# Patient Record
Sex: Female | Born: 1955 | Race: Black or African American | Hispanic: No | Marital: Married | Smoking: Former smoker
Health system: Southern US, Community
[De-identification: ages and names within clinical notes are randomized; demographics above are authoritative.]

## PROBLEM LIST (undated history)

## (undated) DIAGNOSIS — K219 Gastro-esophageal reflux disease without esophagitis: Secondary | ICD-10-CM

## (undated) HISTORY — PX: ABDOMINAL HYSTERECTOMY: SHX81

## (undated) HISTORY — PX: APPENDECTOMY: SHX54

---

## 2006-08-18 ENCOUNTER — Ambulatory Visit: Payer: Self-pay | Admitting: Obstetrics and Gynecology

## 2008-02-10 ENCOUNTER — Ambulatory Visit: Payer: Self-pay | Admitting: Obstetrics and Gynecology

## 2008-02-22 ENCOUNTER — Ambulatory Visit: Payer: Self-pay | Admitting: Obstetrics and Gynecology

## 2009-10-16 ENCOUNTER — Inpatient Hospital Stay: Payer: Self-pay | Admitting: Surgery

## 2009-10-16 ENCOUNTER — Ambulatory Visit: Payer: Self-pay | Admitting: Internal Medicine

## 2009-10-24 ENCOUNTER — Inpatient Hospital Stay: Payer: Self-pay | Admitting: Surgery

## 2009-11-13 ENCOUNTER — Ambulatory Visit: Payer: Self-pay | Admitting: General Surgery

## 2009-11-21 ENCOUNTER — Ambulatory Visit: Payer: Self-pay | Admitting: Specialist

## 2009-12-04 ENCOUNTER — Other Ambulatory Visit: Payer: Self-pay | Admitting: General Surgery

## 2009-12-05 ENCOUNTER — Ambulatory Visit: Payer: Self-pay | Admitting: General Surgery

## 2009-12-11 ENCOUNTER — Ambulatory Visit: Payer: Self-pay | Admitting: General Surgery

## 2009-12-18 ENCOUNTER — Ambulatory Visit: Payer: Self-pay | Admitting: General Surgery

## 2010-12-18 IMAGING — CT CT ABD-PELV W/ CM
1 of 2 series · 14 of 32 positions shown, 18 images · IV contrast (isovue)
Comparison: 10/16/2009

REASON FOR EXAM: Nausea Vomiting Diarrhea  Call Report [id number] Pt to
return to dr office
COMMENTS:

PROCEDURE:     CT  - CT ABDOMEN / PELVIS  W  - October 24, 2009 [DATE]
RESULT:     History: Nausea, vomiting, diarrhea
TECHNIQUE: Multiple axial images of the abdomen and pelvis were performed
from the lung bases to the pubic symphysis, with p.o. contrast and with 100
ml of Isovue 370 intravenous contrast.

[Series 2: abdomen · axial · 0.62mm/px · z∈[-142,+292]mm · 14 of 95 slices shown, 18 images]
[im 4/95  soft-tissue]
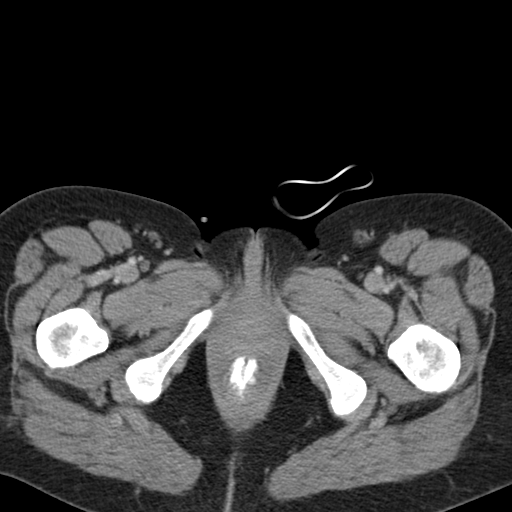
[im 4/95  bone]
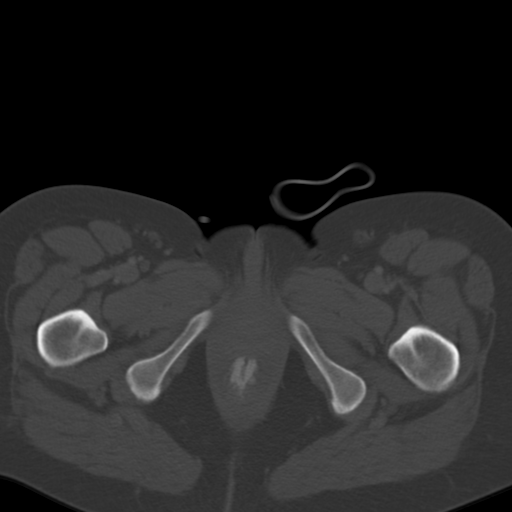
[im 12/95  soft-tissue]
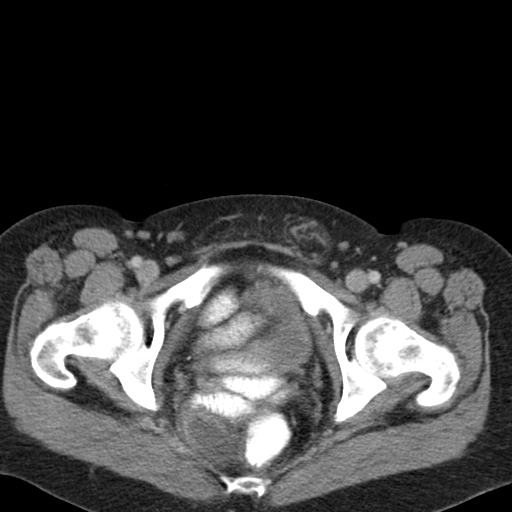
[im 20/95  soft-tissue]
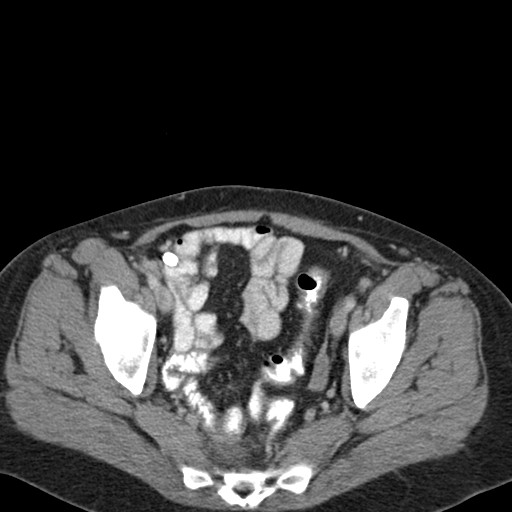
[im 28/95  soft-tissue]
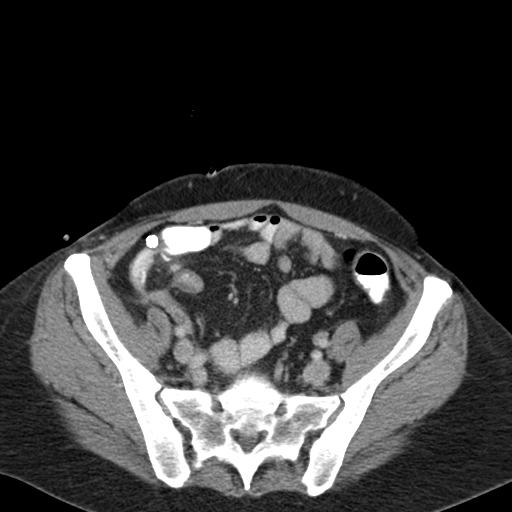
[im 36/95  soft-tissue]
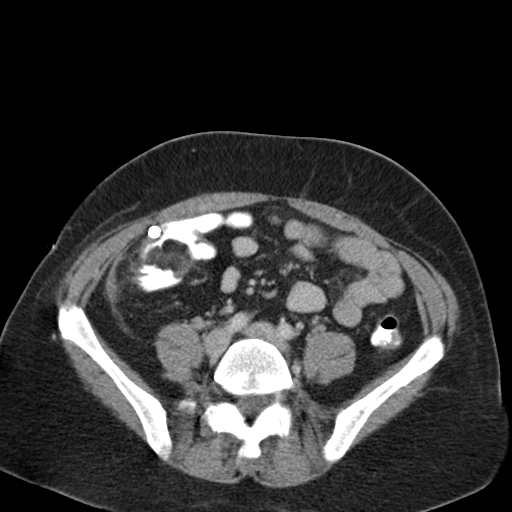
[im 44/95  soft-tissue]
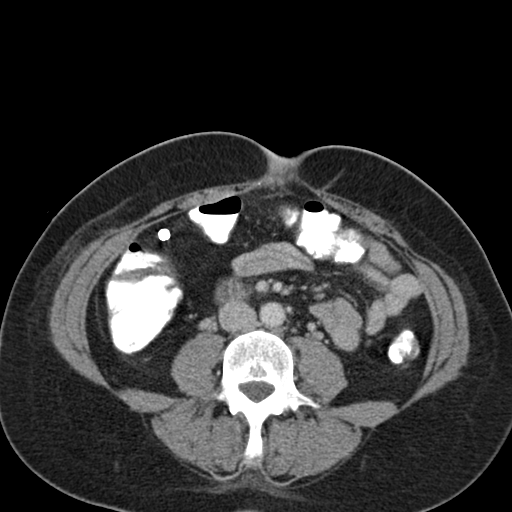
[im 51/95  soft-tissue]
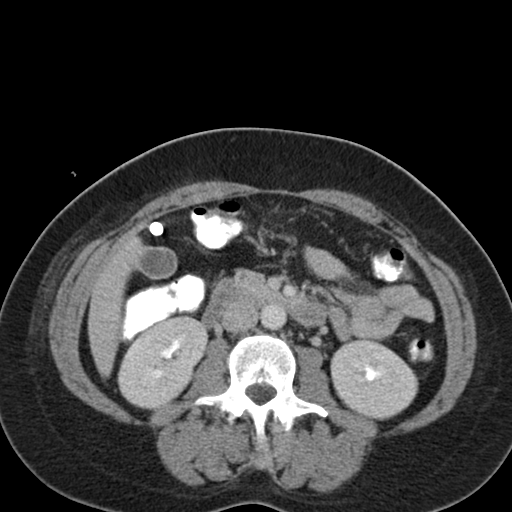
[im 59/95  soft-tissue]
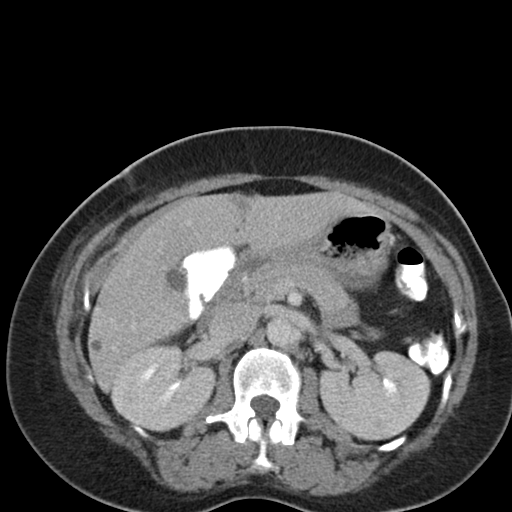
[im 67/95  soft-tissue]
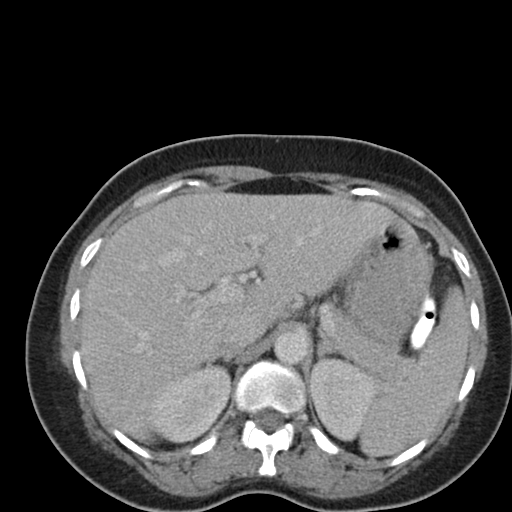
[im 67/95  bone]
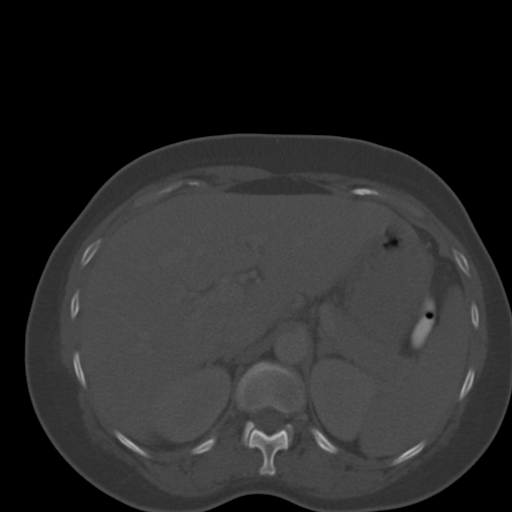
[im 75/95  soft-tissue]
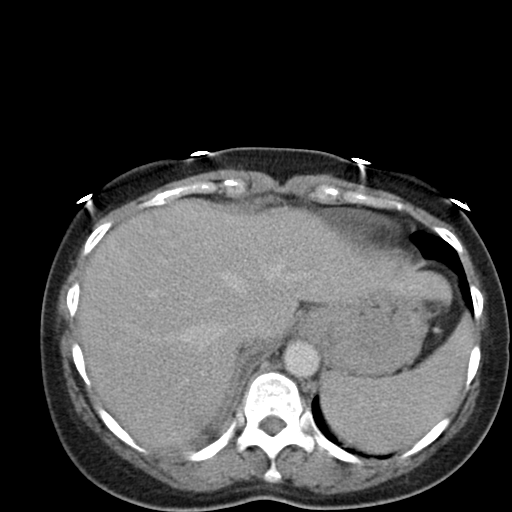
[im 79/95  lung]
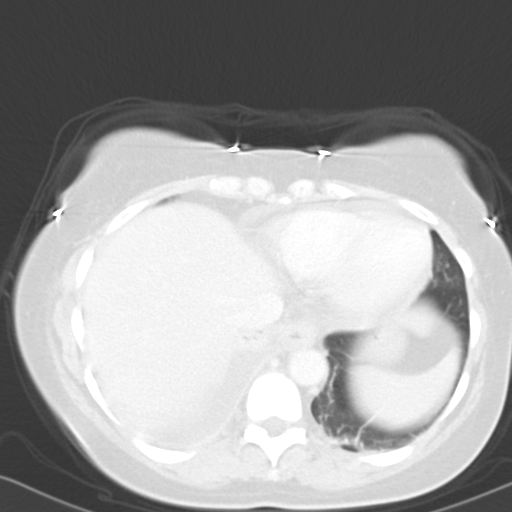
[im 83/95  soft-tissue]
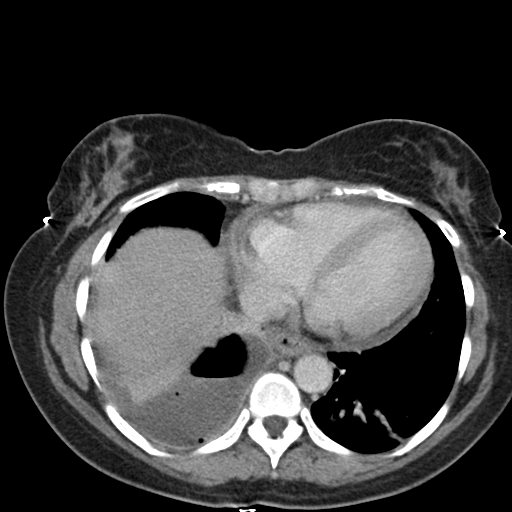
[im 83/95  lung]
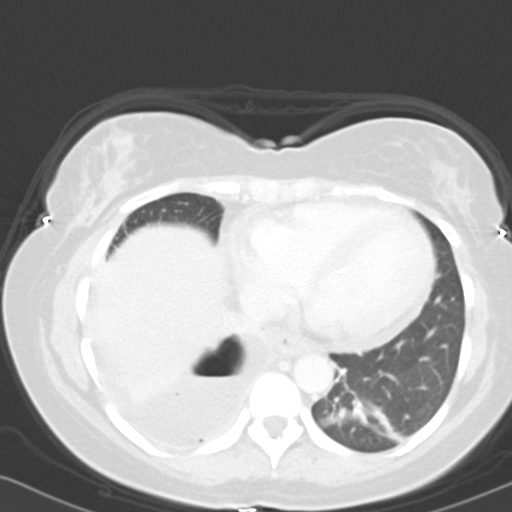
[im 87/95  lung]
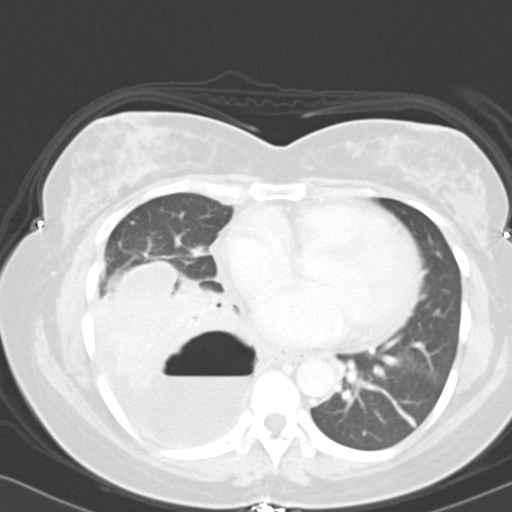
[im 91/95  soft-tissue]
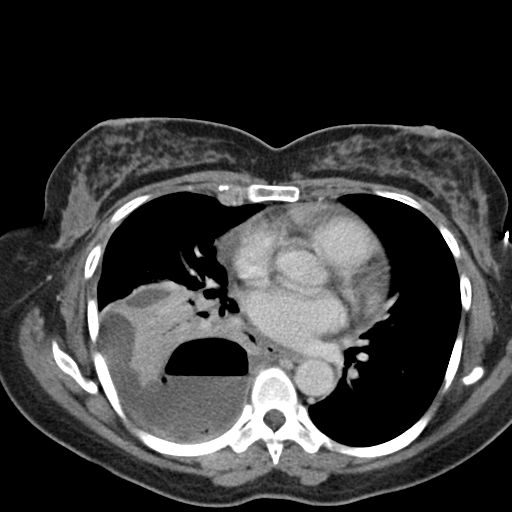
[im 91/95  lung]
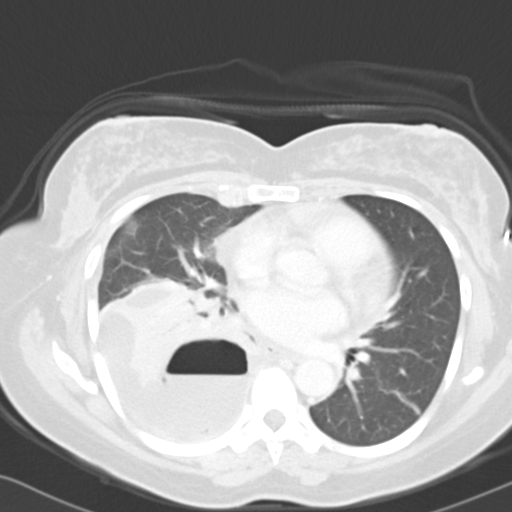

[14 of 32 positions shown; findings below may reference images not displayed]

FINDINGS: There is a moderate sized right pleural effusion with ac air-fluid level
most concerning for an empyema in the absence of recent instrumentation.
There is associated airspace disease which may represent atelectasis versus
infiltrate. There is left basilar atelectasis.

The liver demonstrates no focal abnormality. There is no intrahepatic or
extrahepatic biliary ductal dilatation. The gallbladder is unremarkable. The
spleen demonstrates no focal abnormality. The kidneys, adrenal glands, and
pancreas are normal. The bladder is unremarkable.

The stomach, duodenum, small intestine, and large intestine demonstrate no
contrast extravasation or dilatation. There is a 3 cm right perirectal fluid
collection most concerning for an abscess. There is a small 1.2 x 2.5 cm
fluid collection along the right lower quadrant which may represent an
abscess versus postoperative seroma. There is a surgical drain in the right
lower quadrant. There is no pneumoperitoneum, pneumatosis, or portal venous
gas. There is no abdominal or pelvic free fluid. There is no
lymphadenopathy.

The abdominal aorta is normal in caliber with atherosclerosis.

The osseous structures are unremarkable.
IMPRESSION: 1. There is a moderate sized right pleural effusion with ac air-fluid level
most concerning for an empyema in the absence of recent instrumentation.
There is associated airspace disease which may represent atelectasis versus
infiltrate. There is left basilar atelectasis.

2. There is a 3 cm right perirectal fluid collection most concerning for an
abscess. There is a small 1.2 x 2.5 cm fluid collection along the right
lower quadrant which may represent an abscess versus postoperative seroma.

These findings were communicated to Dr. Padam on 10/24/2009 at 1053 hours.

## 2010-12-19 IMAGING — US US PERC PLEURAL DRAIN W/INDWELL CATH W/IMG GUIDE
1 series · 1 of 1 positions shown · non-contrast
Comparison: none

REASON FOR EXAM: emphyema
COMMENTS:

PROCEDURE:     US  - US PLEURAL EFFUSION RIGHT  - October 25, 2009  [DATE]
RESULT:     Comparison: None

[Series 1: us perc pleural drain w/indwell cath w/img guide · 1 of 1 slices shown]
[im 1/1]
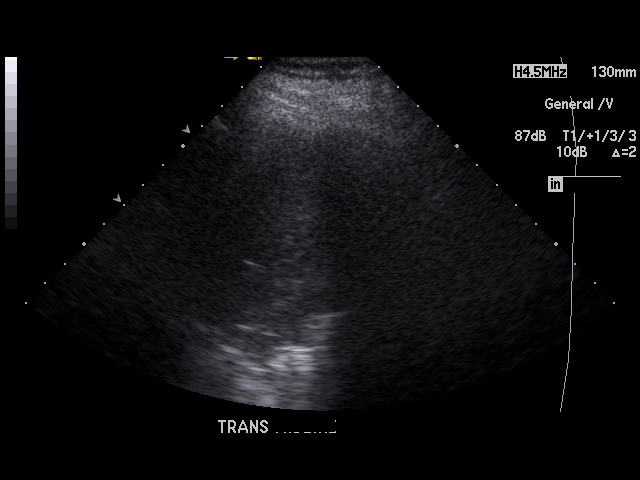

[1 of 1 positions shown; findings below may reference images not displayed]

FINDINGS: Single sonographic images obtained labeled transmidline. Imaging was
performed without a radiologist present. Imaging was utilized by Dr. Remind
for thoracentesis tube placement. There is fluid present.
IMPRESSION: Please see above.

## 2010-12-20 IMAGING — CR DG CHEST 1V PORT
1 series · 1 of 1 positions shown · non-contrast
Comparison: none

REASON FOR EXAM: right empyema
COMMENTS:

[view not recorded]
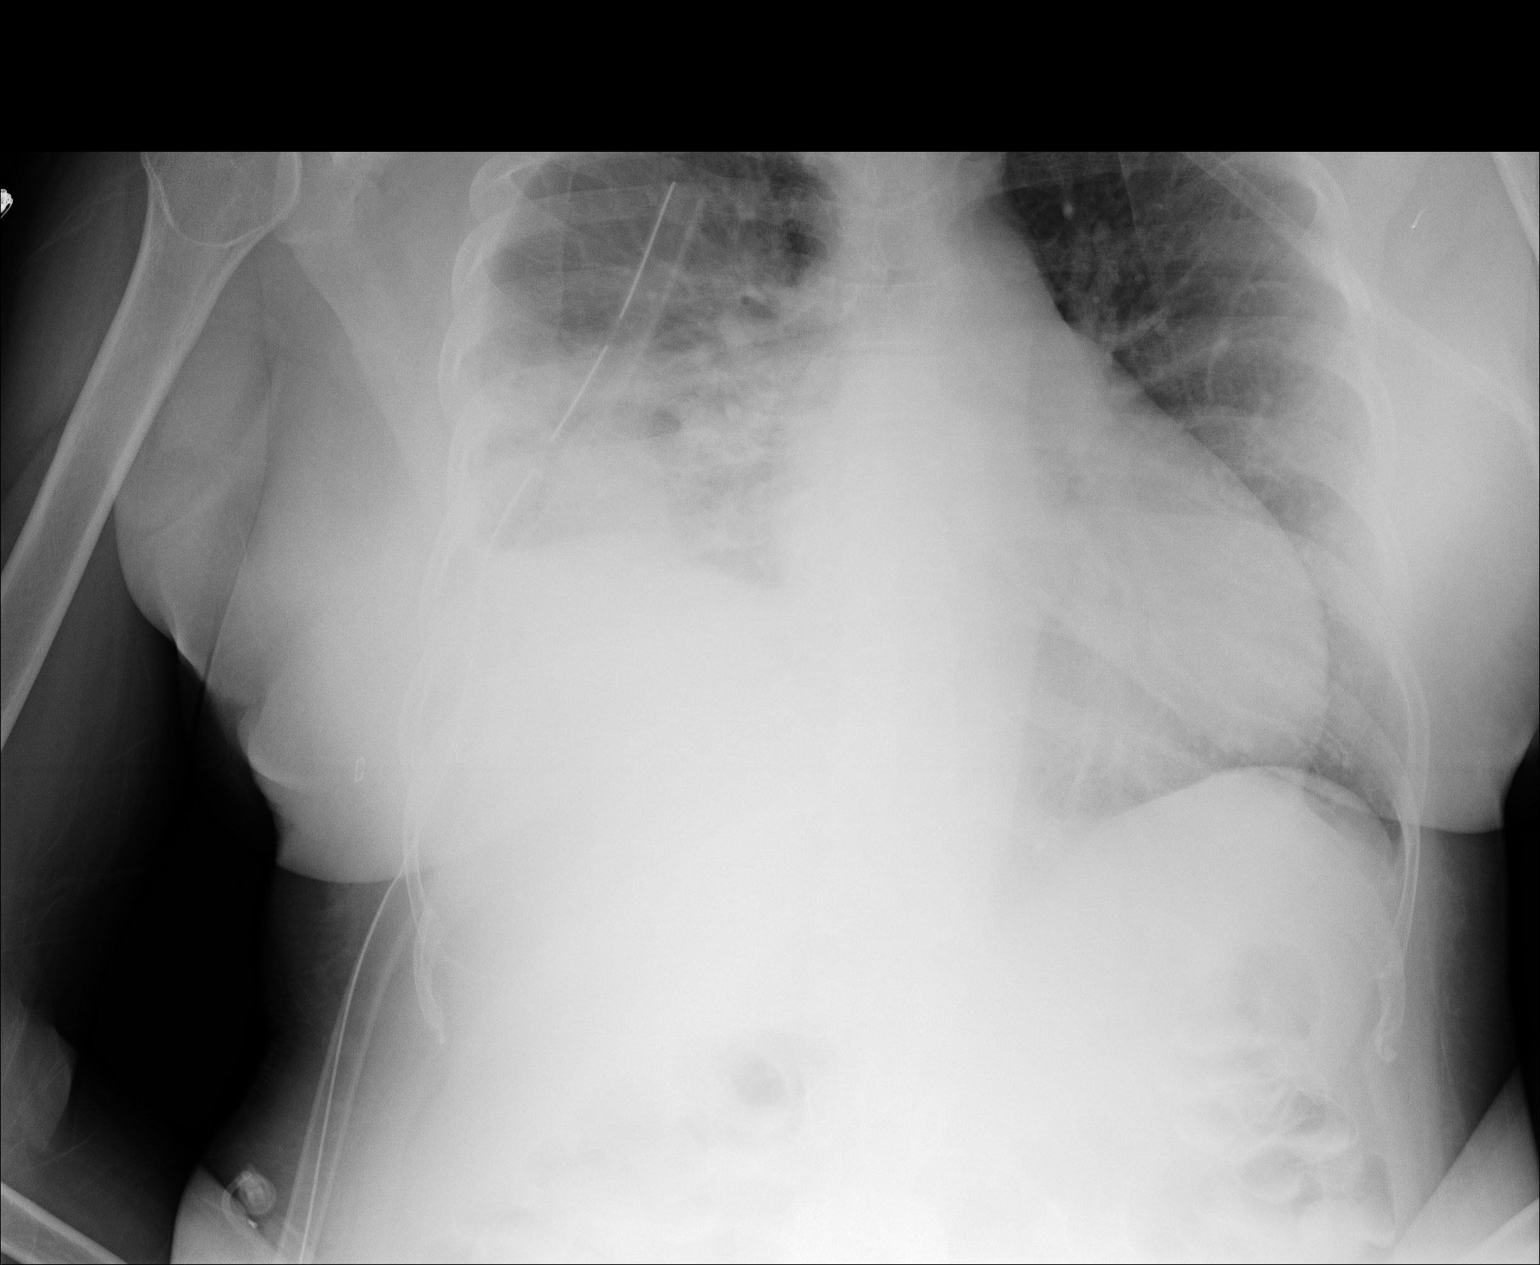

[1 of 1 positions shown; findings below may reference images not displayed]

PROCEDURE:     DXR - DXR PORTABLE CHEST SINGLE VIEW  - October 26, 2009  [DATE]

RESULT:     Comparison is made to study 25 October, 2009.

The chest tube remains in place on the right. The lung remains low volume.
Elevation of the right hemidiaphragm and/or right basilar atelectasis is
persistent. The left lung is clear. The cardiac silhouette is top normal in
size and the central pulmonary vascularity is prominent.
IMPRESSION: 1. There has been little change in the appearance of the right lung.
2. The left lung is clear.

## 2012-03-02 ENCOUNTER — Ambulatory Visit: Payer: Self-pay | Admitting: Family Medicine

## 2014-05-18 ENCOUNTER — Ambulatory Visit: Payer: Self-pay | Admitting: Family Medicine

## 2015-04-27 ENCOUNTER — Encounter (HOSPITAL_BASED_OUTPATIENT_CLINIC_OR_DEPARTMENT_OTHER): Payer: Self-pay | Admitting: *Deleted

## 2015-04-27 NOTE — H&P (Signed)
  Subjective:    Patient ID: Connie Atkins is a 59 y.o. female.  HPI  Referred by daughter who was treated in this office for similar problem. Reports 2 years history posterior left thigh/buttock mass that has continued to grow and now painful, especially with sitting. 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 No similar lesions.   Most significant PMH is ruptured appy at age 50 requiring 72 month hospital stay for sepsis.  Review of Systems    12 point review negative  Objective:   Physical Exam  Cardiovascular: Normal rate and regular rhythm.  Pulmonary/Chest: Effort normal and breath sounds normal.  Abdominal: Soft.  Musculoskeletal:  Posterior left thigh at junction with buttock, slightly distal to buttock crease nonmobile mass 5 x 16 cm with TTP, no punctum, no fluctulence, firm  Skin:  Fitzpatrick 6   Multiple hypopigmented scars legs, some flat hyperpigmented scars back    Assessment:     Lipoma     Plan:     Suspect lipoma. Given growth and now symptomatic rec excision. Given size plan in OR. Reviewed incisions, sutures, time off work. Would try to keep pressure off area as much as possible while healing and this may mean altering her work routine. Reviewed benign nature of majority lipomas, will send for pathology to ensure. Pictures taken. Discussed scar maturation will take months, risk hyper or hypopigmentation given skin type.          Irene Limbo, MD [facility] Plastic & Reconstructive Surgery [phone]

## 2015-04-28 ENCOUNTER — Encounter (HOSPITAL_BASED_OUTPATIENT_CLINIC_OR_DEPARTMENT_OTHER)
Admission: RE | Disposition: A | Discharge status: Home / Self Care | Payer: Self-pay | Source: Ambulatory Visit | Attending: Plastic Surgery

## 2015-04-28 ENCOUNTER — Ambulatory Visit (HOSPITAL_BASED_OUTPATIENT_CLINIC_OR_DEPARTMENT_OTHER): Payer: 59 | Admitting: Anesthesiology

## 2015-04-28 ENCOUNTER — Encounter (HOSPITAL_BASED_OUTPATIENT_CLINIC_OR_DEPARTMENT_OTHER): Payer: Self-pay | Admitting: Anesthesiology

## 2015-04-28 ENCOUNTER — Ambulatory Visit (HOSPITAL_BASED_OUTPATIENT_CLINIC_OR_DEPARTMENT_OTHER)
Admission: RE | Admit: 2015-04-28 | Discharge: 2015-04-28 | Disposition: A | Discharge status: Home / Self Care | Payer: 59 | Source: Ambulatory Visit | Attending: Plastic Surgery | Admitting: Plastic Surgery

## 2015-04-28 DIAGNOSIS — K219 Gastro-esophageal reflux disease without esophagitis: Secondary | ICD-10-CM | POA: Diagnosis not present

## 2015-04-28 DIAGNOSIS — Z87891 Personal history of nicotine dependence: Secondary | ICD-10-CM | POA: Diagnosis not present

## 2015-04-28 DIAGNOSIS — D1724 Benign lipomatous neoplasm of skin and subcutaneous tissue of left leg: Secondary | ICD-10-CM | POA: Diagnosis present

## 2015-04-28 DIAGNOSIS — L720 Epidermal cyst: Secondary | ICD-10-CM | POA: Insufficient documentation

## 2015-04-28 HISTORY — DX: Gastro-esophageal reflux disease without esophagitis: K21.9

## 2015-04-28 HISTORY — PX: MASS EXCISION: SHX2000

## 2015-04-28 LAB — POCT HEMOGLOBIN-HEMACUE
Hemoglobin: 14.3 g/dL (ref 12.0–15.0)
Hemoglobin: 15.3 g/dL — ABNORMAL HIGH (ref 12.0–15.0)

## 2015-04-28 SURGERY — EXCISION MASS
Anesthesia: General | Site: Buttocks | Laterality: Left

## 2015-04-28 MED ORDER — HYDROMORPHONE HCL 1 MG/ML IJ SOLN
0.2500 mg | INTRAMUSCULAR | Status: DC | PRN
  Administered 2015-04-28: 0.5 mg via INTRAVENOUS

## 2015-04-28 MED ORDER — OXYCODONE HCL 5 MG/5ML PO SOLN: 5.0000 mg | Freq: Once | ORAL | Status: AC | PRN

## 2015-04-28 MED ORDER — BUPIVACAINE-EPINEPHRINE (PF) 0.25% -1:200000 IJ SOLN
INTRAMUSCULAR | Status: AC
  Filled 2015-04-28: qty 30

## 2015-04-28 MED ORDER — CHLORHEXIDINE GLUCONATE 4 % EX LIQD: 1.0000 "application " | Freq: Once | CUTANEOUS | Status: DC

## 2015-04-28 MED ORDER — FENTANYL CITRATE (PF) 100 MCG/2ML IJ SOLN
INTRAMUSCULAR | Status: AC
  Filled 2015-04-28: qty 4

## 2015-04-28 MED ORDER — BUPIVACAINE-EPINEPHRINE 0.25% -1:200000 IJ SOLN
INTRAMUSCULAR | Status: DC | PRN
  Administered 2015-04-28: 15 mL

## 2015-04-28 MED ORDER — OXYCODONE HCL 5 MG PO TABS
5.0000 mg | ORAL_TABLET | Freq: Once | ORAL | Status: AC | PRN
  Administered 2015-04-28: 5 mg via ORAL

## 2015-04-28 MED ORDER — SUCCINYLCHOLINE CHLORIDE 20 MG/ML IJ SOLN
INTRAMUSCULAR | Status: DC | PRN
  Administered 2015-04-28: 100 mg via INTRAVENOUS

## 2015-04-28 MED ORDER — LACTATED RINGERS IV SOLN
INTRAVENOUS | Status: DC
  Administered 2015-04-28 (×2): via INTRAVENOUS

## 2015-04-28 MED ORDER — ONDANSETRON HCL 4 MG/2ML IJ SOLN
INTRAMUSCULAR | Status: DC | PRN
  Administered 2015-04-28: 4 mg via INTRAVENOUS

## 2015-04-28 MED ORDER — CEFAZOLIN SODIUM-DEXTROSE 2-3 GM-% IV SOLR
INTRAVENOUS | Status: AC
  Filled 2015-04-28: qty 50

## 2015-04-28 MED ORDER — PROPOFOL 10 MG/ML IV BOLUS
INTRAVENOUS | Status: DC | PRN
  Administered 2015-04-28: 180 mg via INTRAVENOUS

## 2015-04-28 MED ORDER — HYDROMORPHONE HCL 1 MG/ML IJ SOLN
INTRAMUSCULAR | Status: AC
  Filled 2015-04-28: qty 1

## 2015-04-28 MED ORDER — MIDAZOLAM HCL 5 MG/5ML IJ SOLN
INTRAMUSCULAR | Status: DC | PRN
  Administered 2015-04-28: 2 mg via INTRAVENOUS

## 2015-04-28 MED ORDER — DEXAMETHASONE SODIUM PHOSPHATE 4 MG/ML IJ SOLN
INTRAMUSCULAR | Status: DC | PRN
  Administered 2015-04-28: 10 mg via INTRAVENOUS

## 2015-04-28 MED ORDER — OXYCODONE HCL 5 MG PO TABS
ORAL_TABLET | ORAL | Status: AC
  Filled 2015-04-28: qty 1

## 2015-04-28 MED ORDER — 0.9 % SODIUM CHLORIDE (POUR BTL) OPTIME
TOPICAL | Status: DC | PRN
  Administered 2015-04-28: 200 mL

## 2015-04-28 MED ORDER — ONDANSETRON HCL 4 MG/2ML IJ SOLN: 4.0000 mg | Freq: Four times a day (QID) | INTRAMUSCULAR | Status: DC | PRN

## 2015-04-28 MED ORDER — LIDOCAINE HCL (CARDIAC) 20 MG/ML IV SOLN
INTRAVENOUS | Status: DC | PRN
  Administered 2015-04-28: 50 mg via INTRAVENOUS

## 2015-04-28 MED ORDER — FENTANYL CITRATE (PF) 100 MCG/2ML IJ SOLN
INTRAMUSCULAR | Status: DC | PRN
  Administered 2015-04-28 (×3): 50 ug via INTRAVENOUS

## 2015-04-28 MED ORDER — CEFAZOLIN SODIUM-DEXTROSE 2-3 GM-% IV SOLR
2.0000 g | INTRAVENOUS | Status: AC
  Administered 2015-04-28: 2 g via INTRAVENOUS

## 2015-04-28 MED ORDER — MIDAZOLAM HCL 2 MG/2ML IJ SOLN
INTRAMUSCULAR | Status: AC
  Filled 2015-04-28: qty 2

## 2015-04-28 MED ORDER — HYDROCODONE-ACETAMINOPHEN 5-325 MG PO TABS: 1.0000 | ORAL_TABLET | Freq: Four times a day (QID) | ORAL | Status: AC | PRN

## 2015-04-28 SURGICAL SUPPLY — 58 items
BENZOIN TINCTURE PRP APPL 2/3 (GAUZE/BANDAGES/DRESSINGS) IMPLANT
BLADE CLIPPER SURG (BLADE) IMPLANT
BLADE SURG 11 STRL SS (BLADE) IMPLANT
BLADE SURG 15 STRL LF DISP TIS (BLADE) ×1 IMPLANT
BLADE SURG 15 STRL SS (BLADE) ×1
CANISTER SUCT 1200ML W/VALVE (MISCELLANEOUS) ×2 IMPLANT
CHLORAPREP W/TINT 26ML (MISCELLANEOUS) IMPLANT
COVER BACK TABLE 60X90IN (DRAPES) ×2 IMPLANT
COVER MAYO STAND STRL (DRAPES) ×2 IMPLANT
DRAIN JP 10F RND SILICONE (MISCELLANEOUS) IMPLANT
DRAPE LAPAROTOMY 100X72 PEDS (DRAPES) ×2 IMPLANT
DRAPE U-SHAPE 76X120 STRL (DRAPES) ×2 IMPLANT
ELECT COATED BLADE 2.86 ST (ELECTRODE) ×2 IMPLANT
ELECT NEEDLE BLADE 2-5/6 (NEEDLE) IMPLANT
ELECT REM PT RETURN 9FT ADLT (ELECTROSURGICAL)
ELECT REM PT RETURN 9FT PED (ELECTROSURGICAL)
ELECTRODE REM PT RETRN 9FT PED (ELECTROSURGICAL) IMPLANT
ELECTRODE REM PT RTRN 9FT ADLT (ELECTROSURGICAL) IMPLANT
EVACUATOR SILICONE 100CC (DRAIN) IMPLANT
GAUZE XEROFORM 1X8 LF (GAUZE/BANDAGES/DRESSINGS) IMPLANT
GLOVE BIO SURGEON STRL SZ 6 (GLOVE) ×2 IMPLANT
GLOVE BIOGEL PI IND STRL 7.0 (GLOVE) ×1 IMPLANT
GLOVE BIOGEL PI INDICATOR 7.0 (GLOVE) ×1
GLOVE ECLIPSE 6.5 STRL STRAW (GLOVE) ×2 IMPLANT
GLOVE EXAM NITRILE MD LF STRL (GLOVE) ×2 IMPLANT
GOWN STRL REUS W/ TWL LRG LVL3 (GOWN DISPOSABLE) ×2 IMPLANT
GOWN STRL REUS W/TWL LRG LVL3 (GOWN DISPOSABLE) ×2
LIQUID BAND (GAUZE/BANDAGES/DRESSINGS) IMPLANT
NEEDLE HYPO 30GX1 BEV (NEEDLE) IMPLANT
NEEDLE PRECISIONGLIDE 27X1.5 (NEEDLE) ×2 IMPLANT
NS IRRIG 1000ML POUR BTL (IV SOLUTION) IMPLANT
PACK BASIN DAY SURGERY FS (CUSTOM PROCEDURE TRAY) ×2 IMPLANT
PENCIL BUTTON HOLSTER BLD 10FT (ELECTRODE) ×2 IMPLANT
RUBBERBAND STERILE (MISCELLANEOUS) IMPLANT
SHEET MEDIUM DRAPE 40X70 STRL (DRAPES) IMPLANT
SLEEVE SCD COMPRESS KNEE MED (MISCELLANEOUS) ×2 IMPLANT
SPONGE GAUZE 2X2 8PLY STRL LF (GAUZE/BANDAGES/DRESSINGS) IMPLANT
SPONGE GAUZE 4X4 12PLY STER LF (GAUZE/BANDAGES/DRESSINGS) IMPLANT
SPONGE LAP 18X18 X RAY DECT (DISPOSABLE) ×2 IMPLANT
STRIP CLOSURE SKIN 1/2X4 (GAUZE/BANDAGES/DRESSINGS) IMPLANT
SUCTION FRAZIER TIP 10 FR DISP (SUCTIONS) IMPLANT
SUT ETHILON 4 0 PS 2 18 (SUTURE) IMPLANT
SUT MNCRL AB 4-0 PS2 18 (SUTURE) IMPLANT
SUT MON AB 5-0 P3 18 (SUTURE) IMPLANT
SUT PLAIN 5 0 P 3 18 (SUTURE) IMPLANT
SUT PROLENE 4 0 PS 2 18 (SUTURE) ×2 IMPLANT
SUT PROLENE 5 0 P 3 (SUTURE) IMPLANT
SUT PROLENE 6 0 P 1 18 (SUTURE) IMPLANT
SUT VIC AB 3-0 FS2 27 (SUTURE) ×2 IMPLANT
SUT VICRYL 4-0 PS2 18IN ABS (SUTURE) IMPLANT
SWAB COLLECTION DEVICE MRSA (MISCELLANEOUS) ×2 IMPLANT
SYR BULB 3OZ (MISCELLANEOUS) ×2 IMPLANT
SYR CONTROL 10ML LL (SYRINGE) ×2 IMPLANT
TOWEL OR 17X24 6PK STRL BLUE (TOWEL DISPOSABLE) ×2 IMPLANT
TRAY DSU PREP LF (CUSTOM PROCEDURE TRAY) ×2 IMPLANT
TUBE ANAEROBIC SPECIMEN COL (MISCELLANEOUS) ×2 IMPLANT
TUBE CONNECTING 20X1/4 (TUBING) ×2 IMPLANT
YANKAUER SUCT BULB TIP NO VENT (SUCTIONS) ×2 IMPLANT

## 2015-04-28 NOTE — Anesthesia Procedure Notes (Signed)
Procedure Name: Intubation Date/Time: 04/28/2015 9:23 AM Performed by: Maryella Shivers Pre-anesthesia Checklist: Patient identified, Emergency Drugs available, Suction available and Patient being monitored Patient Re-evaluated:Patient Re-evaluated prior to inductionOxygen Delivery Method: Circle System Utilized Preoxygenation: Pre-oxygenation with 100% oxygen Intubation Type: IV induction Ventilation: Mask ventilation without difficulty Laryngoscope Size: Mac and 3 Grade View: Grade I Tube type: Oral Tube size: 7.0 mm Number of attempts: 1 Airway Equipment and Method: Stylet and Oral airway Placement Confirmation: ETT inserted through vocal cords under direct vision,  positive ETCO2 and breath sounds checked- equal and bilateral Secured at: 20 cm Tube secured with: Tape Dental Injury: Teeth and Oropharynx as per pre-operative assessment

## 2015-04-28 NOTE — Interval H&P Note (Signed)
History and Physical Interval Note:  04/28/2015 6:59 AM  Connie Atkins  has presented today for surgery, with the diagnosis of LIPOMA LEFT BUTTOCK AND THIGH   The various methods of treatment have been discussed with the patient and family. After consideration of risks, benefits and other options for treatment, the patient has consented to  Procedure(s): EXCISION SUBCUTANEOUS MASS 6CM  (N/A) left thigh as a surgical intervention .  The patient's history has been reviewed, patient examined, no change in status, stable for surgery.  I have reviewed the patient's chart and labs.  Questions were answered to the patient's satisfaction.     Connie Atkins

## 2015-04-28 NOTE — Anesthesia Postprocedure Evaluation (Signed)
Anesthesia Post Note  Patient: Connie Atkins  Procedure(s) Performed: Procedure(s) (LRB): EXCISION CYST (RUPTURED) 6CM LEFT POSTERIOR UPPER THIGH/BUTTOCK (Left)  Anesthesia type: General  Patient location: PACU  Post pain: Pain level controlled and Adequate analgesia  Post assessment: Post-op Vital signs reviewed, Patient's Cardiovascular Status Stable, Respiratory Function Stable, Patent Airway and Pain level controlled  Last Vitals:  Filed Vitals:   04/28/15 1100  BP: 122/86  Pulse: 78  Temp:   Resp: 16    Post vital signs: Reviewed and stable  Level of consciousness: awake, alert  and oriented  Complications: No apparent anesthesia complications

## 2015-04-28 NOTE — Anesthesia Preprocedure Evaluation (Signed)
Anesthesia Evaluation  Patient identified by MRN, date of birth, ID band Patient awake    Reviewed: Allergy & Precautions, NPO status , Patient's Chart, lab work & pertinent test results  Airway Mallampati: II   Neck ROM: full    Dental   Pulmonary former smoker,  breath sounds clear to auscultation        Cardiovascular negative cardio ROS  Rhythm:regular Rate:Normal     Neuro/Psych    GI/Hepatic GERD-  ,  Endo/Other  obese  Renal/GU      Musculoskeletal   Abdominal   Peds  Hematology   Anesthesia Other Findings   Reproductive/Obstetrics                             Anesthesia Physical Anesthesia Plan  ASA: II  Anesthesia Plan: General   Post-op Pain Management:    Induction: Intravenous  Airway Management Planned: Oral ETT  Additional Equipment:   Intra-op Plan:   Post-operative Plan: Extubation in OR  Informed Consent: I have reviewed the patients History and Physical, chart, labs and discussed the procedure including the risks, benefits and alternatives for the proposed anesthesia with the patient or authorized representative who has indicated his/her understanding and acceptance.     Plan Discussed with: CRNA, Anesthesiologist and Surgeon  Anesthesia Plan Comments:         Anesthesia Quick Evaluation

## 2015-04-28 NOTE — Op Note (Signed)
Operative Note   DATE OF OPERATION: 5.27.2016  LOCATION: [facility]-outpatient  SURGICAL DIVISION: Plastic Surgery  PREOPERATIVE DIAGNOSES:  Lipoma left thigh  POSTOPERATIVE DIAGNOSES:  Cyst left thigh  PROCEDURE: 1. Excision benign lesion 5 cm 2. Layered closure thigh 5 cm  SURGEON: Irene Limbo MD MBA  ASSISTANT: none  ANESTHESIA:  General.   EBL: minimal  COMPLICATIONS: None.   INDICATIONS FOR PROCEDURE:  The patient, Connie Atkins, is a 59 y.o. female born on 08-18-1956, is here for excision mass left thigh present for several months without drainage, cellulitis.   FINDINGS: No evidence of lipoma. Intraoperatively, large cystic cavity with copious amount of fluid.  DESCRIPTION OF PROCEDURE:  The patient's operative site was marked with the patient in the preoperative area. The patient was taken to the operating room. SCDs were placed and IV antibiotics were given. Patient placed in prone position. The patient's operative site was prepped and draped in a sterile fashion. A time out was performed and all information was confirmed to be correct. Transverse incision made over mass, symmetric with position of opposite buttock crease. Encountered copious milky fluid following incision of skin. Cultures obtained. Following drainage of all fluid, cavity wall noted. The cyst cavity was excised with cautery to normal appearing subcutaneous fat. Wound irrigated and hemostasis obtained. Closure completed with interrupted 3-0 vicryl in superficial fascia, 3-0 vicryl in dermis, and skin closure with running 4-0 prolene suture. Telfa and dry dressing applied. Patient returned to supine position.   The patient was allowed to wake from anesthesia, extubated and taken to the recovery room in satisfactory condition.   SPECIMENS: left thigh cyst wall, cultures of fluid  DRAINS: none  Irene Limbo, MD [facility] Plastic & Reconstructive Surgery [phone]

## 2015-04-28 NOTE — Transfer of Care (Signed)
Immediate Anesthesia Transfer of Care Note  Patient: Connie Atkins  Procedure(s) Performed: Procedure(s): EXCISION CYST (RUPTURED) 6CM LEFT POSTERIOR UPPER THIGH/BUTTOCK (Left)  Patient Location: PACU  Anesthesia Type:General  Level of Consciousness: sedated  Airway & Oxygen Therapy: Patient Spontanous Breathing and Patient connected to face mask oxygen  Post-op Assessment: Report given to RN and Post -op Vital signs reviewed and stable  Post vital signs: Reviewed and stable  Last Vitals:  Filed Vitals:   04/28/15 0806  BP: 126/74  Pulse: 63  Temp: 36.6 C  Resp: 20    Complications: No apparent anesthesia complications

## 2015-04-28 NOTE — Discharge Instructions (Signed)

## 2015-05-02 ENCOUNTER — Encounter (HOSPITAL_BASED_OUTPATIENT_CLINIC_OR_DEPARTMENT_OTHER): Payer: Self-pay | Admitting: Plastic Surgery

## 2015-05-02 LAB — WOUND CULTURE: CULTURE: NO GROWTH

## 2015-05-03 LAB — ANAEROBIC CULTURE

## 2016-02-26 ENCOUNTER — Other Ambulatory Visit: Payer: Self-pay | Admitting: Family Medicine

## 2016-02-26 DIAGNOSIS — Z1231 Encounter for screening mammogram for malignant neoplasm of breast: Secondary | ICD-10-CM

## 2016-04-16 ENCOUNTER — Ambulatory Visit: Payer: 59 | Attending: Family Medicine

## 2016-05-09 ENCOUNTER — Ambulatory Visit: Admission: RE | Admit: 2016-05-09 | Payer: 59 | Source: Ambulatory Visit

## 2017-05-14 DIAGNOSIS — Z0000 Encounter for general adult medical examination without abnormal findings: Secondary | ICD-10-CM | POA: Diagnosis not present

## 2017-05-22 DIAGNOSIS — Z0000 Encounter for general adult medical examination without abnormal findings: Secondary | ICD-10-CM | POA: Diagnosis not present

## 2017-05-22 DIAGNOSIS — R7303 Prediabetes: Secondary | ICD-10-CM | POA: Diagnosis not present

## 2018-06-01 DIAGNOSIS — R7303 Prediabetes: Secondary | ICD-10-CM | POA: Diagnosis not present

## 2018-06-01 DIAGNOSIS — Z0000 Encounter for general adult medical examination without abnormal findings: Secondary | ICD-10-CM | POA: Diagnosis not present

## 2018-06-02 DIAGNOSIS — Z0000 Encounter for general adult medical examination without abnormal findings: Secondary | ICD-10-CM | POA: Diagnosis not present

## 2018-06-02 DIAGNOSIS — R7303 Prediabetes: Secondary | ICD-10-CM | POA: Diagnosis not present

## 2022-01-11 ENCOUNTER — Ambulatory Visit: Payer: 59

## 2022-01-14 ENCOUNTER — Ambulatory Visit: Payer: No Typology Code available for payment source | Attending: Internal Medicine

## 2022-01-14 ENCOUNTER — Other Ambulatory Visit (HOSPITAL_BASED_OUTPATIENT_CLINIC_OR_DEPARTMENT_OTHER): Payer: Self-pay

## 2022-01-14 DIAGNOSIS — Z23 Encounter for immunization: Secondary | ICD-10-CM

## 2022-01-14 MED ORDER — PFIZER COVID-19 VAC BIVALENT 30 MCG/0.3ML IM SUSP
INTRAMUSCULAR | 0 refills | Status: AC
  Filled 2022-01-14: qty 0.3, 1d supply, fill #0

## 2022-01-14 NOTE — Progress Notes (Signed)
° °  Covid-19 Vaccination Clinic  Name:  Connie Atkins    MRN: [id number] DOB: 09/07/1956  01/14/2022  [name] was observed post Covid-19 immunization for 15 minutes without incident. She was provided with Vaccine Information Sheet and instruction to access the V-Safe system.   [name] was instructed to call 911 with any severe reactions post vaccine: Difficulty breathing  Swelling of face and throat  A fast heartbeat  A bad rash all over body  Dizziness and weakness   Immunizations Administered     Name Date Dose VIS Date Route   Pfizer Covid-19 Vaccine Bivalent Booster 01/14/2022  9:19 AM 0.3 mL 08/01/2021 Intramuscular   Manufacturer: [employer]   Lot: [id number]   Freeborn: [id number]

## 2022-04-02 ENCOUNTER — Ambulatory Visit: Payer: 59 | Admitting: Adult Health

## 2022-04-15 ENCOUNTER — Ambulatory Visit: Admit: 2022-04-15 | Payer: No Typology Code available for payment source

## 2022-04-15 SURGERY — COLONOSCOPY WITH PROPOFOL
Anesthesia: General

## 2022-05-30 ENCOUNTER — Ambulatory Visit: Payer: No Typology Code available for payment source | Admitting: Nurse Practitioner

## 2023-01-24 ENCOUNTER — Encounter: Payer: Self-pay | Admitting: *Deleted

## 2023-01-27 ENCOUNTER — Ambulatory Visit: Payer: 59 | Admitting: Anesthesiology

## 2023-01-27 ENCOUNTER — Ambulatory Visit
Admission: RE | Admit: 2023-01-27 | Discharge: 2023-01-27 | Disposition: A | Discharge status: Home / Self Care | Payer: 59 | Attending: Gastroenterology | Admitting: Gastroenterology

## 2023-01-27 ENCOUNTER — Encounter
Admission: RE | Disposition: A | Discharge status: Home / Self Care | Payer: Self-pay | Source: Home / Self Care | Attending: Gastroenterology

## 2023-01-27 DIAGNOSIS — K640 First degree hemorrhoids: Secondary | ICD-10-CM | POA: Insufficient documentation

## 2023-01-27 DIAGNOSIS — Z1211 Encounter for screening for malignant neoplasm of colon: Secondary | ICD-10-CM | POA: Insufficient documentation

## 2023-01-27 DIAGNOSIS — Z09 Encounter for follow-up examination after completed treatment for conditions other than malignant neoplasm: Secondary | ICD-10-CM | POA: Insufficient documentation

## 2023-01-27 DIAGNOSIS — Z87891 Personal history of nicotine dependence: Secondary | ICD-10-CM | POA: Diagnosis not present

## 2023-01-27 DIAGNOSIS — D175 Benign lipomatous neoplasm of intra-abdominal organs: Secondary | ICD-10-CM | POA: Diagnosis not present

## 2023-01-27 DIAGNOSIS — K649 Unspecified hemorrhoids: Secondary | ICD-10-CM | POA: Diagnosis not present

## 2023-01-27 DIAGNOSIS — D1779 Benign lipomatous neoplasm of other sites: Secondary | ICD-10-CM | POA: Insufficient documentation

## 2023-01-27 HISTORY — PX: COLONOSCOPY WITH PROPOFOL: SHX5780

## 2023-01-27 SURGERY — COLONOSCOPY WITH PROPOFOL
Anesthesia: General

## 2023-01-27 MED ORDER — LIDOCAINE HCL (CARDIAC) PF 100 MG/5ML IV SOSY
PREFILLED_SYRINGE | INTRAVENOUS | Status: DC | PRN
  Administered 2023-01-27: 50 mg via INTRAVENOUS

## 2023-01-27 MED ORDER — PROPOFOL 10 MG/ML IV BOLUS
INTRAVENOUS | Status: DC | PRN
  Administered 2023-01-27: 80 mg via INTRAVENOUS

## 2023-01-27 MED ORDER — PROPOFOL 1000 MG/100ML IV EMUL
INTRAVENOUS | Status: AC
  Filled 2023-01-27: qty 100

## 2023-01-27 MED ORDER — PROPOFOL 500 MG/50ML IV EMUL
INTRAVENOUS | Status: DC | PRN
  Administered 2023-01-27: 150 ug/kg/min via INTRAVENOUS

## 2023-01-27 MED ORDER — SODIUM CHLORIDE 0.9 % IV SOLN
INTRAVENOUS | Status: DC
  Administered 2023-01-27: 20 mL/h via INTRAVENOUS

## 2023-01-27 NOTE — Anesthesia Preprocedure Evaluation (Addendum)
Anesthesia Evaluation  Patient identified by MRN, date of birth, ID band Patient awake    Reviewed: Allergy & Precautions, NPO status , Patient's Chart, lab work & pertinent test results  History of Anesthesia Complications Negative for: history of anesthetic complications  Airway Mallampati: I   Neck ROM: Full    Dental  (+) Missing   Pulmonary former smoker (quit 2008)   Pulmonary exam normal breath sounds clear to auscultation       Cardiovascular Exercise Tolerance: Good negative cardio ROS Normal cardiovascular exam Rhythm:Regular Rate:Normal     Neuro/Psych negative neurological ROS     GI/Hepatic ,GERD  ,,  Endo/Other  Prediabetes, obesity  Renal/GU negative Renal ROS     Musculoskeletal   Abdominal   Peds  Hematology  (+) REFUSES BLOOD PRODUCTS, JEHOVAH'S WITNESS  Anesthesia Other Findings   Reproductive/Obstetrics                             Anesthesia Physical Anesthesia Plan  ASA: 2  Anesthesia Plan: General   Post-op Pain Management:    Induction: Intravenous  PONV Risk Score and Plan: 3 and Propofol infusion, TIVA and Treatment may vary due to age or medical condition  Airway Management Planned: Natural Airway  Additional Equipment:   Intra-op Plan:   Post-operative Plan:   Informed Consent: I have reviewed the patients History and Physical, chart, labs and discussed the procedure including the risks, benefits and alternatives for the proposed anesthesia with the patient or authorized representative who has indicated his/her understanding and acceptance.       Plan Discussed with: CRNA  Anesthesia Plan Comments: (LMA/GETA backup discussed.  Patient consented for risks of anesthesia including but not limited to:  - adverse reactions to medications - damage to eyes, teeth, lips or other oral mucosa - nerve damage due to positioning  - sore throat or  hoarseness - damage to heart, brain, nerves, lungs, other parts of body or loss of life  Informed patient about role of CRNA in peri- and intra-operative care.  Patient voiced understanding.)       Anesthesia Quick Evaluation

## 2023-01-27 NOTE — Anesthesia Procedure Notes (Signed)
Date/Time: 01/27/2023 7:42 AM  Performed by: Johnna Acosta, CRNAPre-anesthesia Checklist: Patient identified, Emergency Drugs available, Suction available, Patient being monitored and Timeout performed Patient Re-evaluated:Patient Re-evaluated prior to induction Oxygen Delivery Method: Nasal cannula Preoxygenation: Pre-oxygenation with 100% oxygen Induction Type: IV induction

## 2023-01-27 NOTE — Transfer of Care (Signed)
Immediate Anesthesia Transfer of Care Note  Patient: Connie Atkins  Procedure(s) Performed: COLONOSCOPY WITH PROPOFOL  Patient Location: Endoscopy Unit  Anesthesia Type:General  Level of Consciousness: sedated and drowsy  Airway & Oxygen Therapy: Patient Spontanous Breathing  Post-op Assessment: Report given to RN and Post -op Vital signs reviewed and stable  Post vital signs: Reviewed and stable  Last Vitals:  Vitals Value Taken Time  BP 102/67 01/27/23 0801  Temp 36.2 C 01/27/23 0801  Pulse 66 01/27/23 0801  Resp 17 01/27/23 0801  SpO2 100 % 01/27/23 0801    Last Pain:  Vitals:   01/27/23 0801  TempSrc: Temporal  PainSc:          Complications: No notable events documented.

## 2023-01-27 NOTE — Interval H&P Note (Signed)
History and Physical Interval Note:  01/27/2023 7:39 AM  Connie Atkins  has presented today for surgery, with the diagnosis of colon cancer screening.  The various methods of treatment have been discussed with the patient and family. After consideration of risks, benefits and other options for treatment, the patient has consented to  Procedure(s): COLONOSCOPY WITH PROPOFOL (N/A) as a surgical intervention.  The patient's history has been reviewed, patient examined, no change in status, stable for surgery.  I have reviewed the patient's chart and labs.  Questions were answered to the patient's satisfaction.     Lesly Rubenstein  Ok to proceed with colonoscopy

## 2023-01-27 NOTE — Op Note (Signed)
[facility] Gastroenterology Patient Name: Connie Atkins Procedure Date: 01/27/2023 7:38 AM MRN: [id number] Account #: [id number] Date of Birth: 1956/09/22 Admit Type: Outpatient Age: 67 Room: [facility] ENDO ROOM 3 Gender: Female Note Status: Finalized Instrument Name: Connie Atkins [id number] Procedure:             Colonoscopy Indications:           Screening for colorectal malignant neoplasm Providers:             Andrey Farmer MD, MD Referring MD:          Dion Body (Referring MD) Medicines:             Monitored Anesthesia Care Complications:         No immediate complications. Procedure:             Pre-Anesthesia Assessment:                        - Prior to the procedure, a History and Physical was                         performed, and patient medications and allergies were                         reviewed. The patient is competent. The risks and                         benefits of the procedure and the sedation options and                         risks were discussed with the patient. All questions                         were answered and informed consent was obtained.                         Patient identification and proposed procedure were                         verified by the physician, the nurse, the                         anesthesiologist, the anesthetist and the technician                         in the endoscopy suite. Mental Status Examination:                         alert and oriented. Airway Examination: normal                         oropharyngeal airway and neck mobility. Respiratory                         Examination: clear to auscultation. CV Examination:                         normal. Prophylactic Antibiotics: The patient does not  require prophylactic antibiotics. Prior                         Anticoagulants: The patient has taken no anticoagulant                         or antiplatelet agents. ASA Grade  Assessment: II - A                         patient with mild systemic disease. After reviewing                         the risks and benefits, the patient was deemed in                         satisfactory condition to undergo the procedure. The                         anesthesia plan was to use monitored anesthesia care                         (MAC). Immediately prior to administration of                         medications, the patient was re-assessed for adequacy                         to receive sedatives. The heart rate, respiratory                         rate, oxygen saturations, blood pressure, adequacy of                         pulmonary ventilation, and response to care were                         monitored throughout the procedure. The physical                         status of the patient was re-assessed after the                         procedure.                        After obtaining informed consent, the colonoscope was                         passed under direct vision. Throughout the procedure,                         the patient's blood pressure, pulse, and oxygen                         saturations were monitored continuously. The                         Colonoscope was introduced through the anus and  advanced to the the cecum, identified by appendiceal                         orifice and ileocecal valve. The colonoscopy was                         performed without difficulty. The patient tolerated                         the procedure well. The quality of the bowel                         preparation was good. The ileocecal valve, appendiceal                         orifice, and rectum were photographed. Findings:      The perianal and digital rectal examinations were normal.      There was a small lipoma, in the ascending colon.      Internal hemorrhoids were found during retroflexion. The hemorrhoids       were Grade I (internal  hemorrhoids that do not prolapse).      The exam was otherwise without abnormality on direct and retroflexion       views. Impression:            - Small lipoma in the ascending colon.                        - Internal hemorrhoids.                        - The examination was otherwise normal on direct and                         retroflexion views.                        - No specimens collected. Recommendation:        - Discharge patient to home.                        - Resume previous diet.                        - Continue present medications.                        - Repeat colonoscopy in 10 years for screening                         purposes.                        - Return to referring physician as previously                         scheduled. Procedure Code(s):     --- Professional ---                        [id number], Colorectal cancer screening; colonoscopy on  individual not meeting criteria for high risk Diagnosis Code(s):     --- Professional ---                        Z12.11, Encounter for screening for malignant neoplasm                         of colon                        D17.5, Benign lipomatous neoplasm of intra-abdominal                         organs                        K64.0, First degree hemorrhoids CPT copyright 2022 American [employer] Association. All rights reserved. The codes documented in this report are preliminary and upon coder review may  be revised to meet current compliance requirements. Andrey Farmer MD, MD 01/27/2023 8:02:01 AM Number of Addenda: 0 Note Initiated On: 01/27/2023 7:38 AM Scope Withdrawal Time: 0 hours 7 minutes 10 seconds  Total Procedure Duration: 0 hours 10 minutes 20 seconds  Estimated Blood Loss:  Estimated blood loss: none.      [facility]

## 2023-01-27 NOTE — Anesthesia Postprocedure Evaluation (Signed)
Anesthesia Post Note  Patient: Connie Atkins  Procedure(s) Performed: COLONOSCOPY WITH PROPOFOL  Patient location during evaluation: PACU Anesthesia Type: General Level of consciousness: awake and alert, oriented and patient cooperative Pain management: pain level controlled Vital Signs Assessment: post-procedure vital signs reviewed and stable Respiratory status: spontaneous breathing, nonlabored ventilation and respiratory function stable Cardiovascular status: blood pressure returned to baseline and stable Postop Assessment: adequate PO intake Anesthetic complications: no   No notable events documented.   Last Vitals:  Vitals:   01/27/23 0811 01/27/23 0821  BP: 121/75 134/79  Pulse: 66 66  Resp: 13 19  Temp:    SpO2: 100% 100%    Last Pain:  Vitals:   01/27/23 0821  TempSrc:   PainSc: 0-No pain                 Darrin Nipper

## 2023-01-27 NOTE — H&P (Signed)
Outpatient short stay form Pre-procedure 01/27/2023  Lesly Rubenstein, MD  Primary Physician: Dion Body, MD  Reason for visit:  Screening  History of present illness:    67 y/o lady here for screening colonoscopy. Last colonoscopy 10 years ago and was reportedly normal. No blood thinners. No family history of GI malignancies. History of hysterectomy.    Current Facility-Administered Medications:    0.9 %  sodium chloride infusion, , Intravenous, Continuous, Taejon Irani, Hilton Cork, MD, Last Rate: 20 mL/hr at 01/27/23 0705, Restarted at 01/27/23 0725  Medications Prior to Admission  Medication Sig Dispense Refill Last Dose   COVID-19 mRNA bivalent vaccine, Pfizer, (PFIZER COVID-19 VAC BIVALENT) injection Inject into the muscle. (Patient not taking: Reported on 01/27/2023) 0.3 mL 0 Completed Course   HYDROcodone-acetaminophen (NORCO) 5-325 MG per tablet Take 1-2 tablets by mouth every 6 (six) hours as needed for moderate pain. 30 tablet 0    UNABLE TO FIND Med Name:Pt unsure of name. Is prescription med for acid reflux.        No Known Allergies   Past Medical History:  Diagnosis Date   GERD (gastroesophageal reflux disease)    occ use priscription med    Review of systems:  Otherwise negative.    Physical Exam  Gen: Alert, oriented. Appears stated age.  HEENT: PERRLA. Lungs: No respiratory distress CV: RRR Abd: soft, benign, no masses Ext: No edema    Planned procedures: Proceed with colonoscopy. The patient understands the nature of the planned procedure, indications, risks, alternatives and potential complications including but not limited to bleeding, infection, perforation, damage to internal organs and possible oversedation/side effects from anesthesia. The patient agrees and gives consent to proceed.  Please refer to procedure notes for findings, recommendations and patient disposition/instructions.     Lesly Rubenstein, MD [facility]  Gastroenterology

## 2023-01-28 ENCOUNTER — Encounter: Payer: Self-pay | Admitting: Gastroenterology

## 2023-03-12 ENCOUNTER — Other Ambulatory Visit (HOSPITAL_COMMUNITY): Payer: Self-pay

## 2023-03-12 MED ORDER — OMRON 3 SERIES BP MONITOR DEVI
0 refills | Status: AC
  Filled 2023-03-12: qty 1, 30d supply, fill #0

## 2023-07-01 DIAGNOSIS — E669 Obesity, unspecified: Secondary | ICD-10-CM | POA: Diagnosis not present

## 2023-07-01 DIAGNOSIS — Z0000 Encounter for general adult medical examination without abnormal findings: Secondary | ICD-10-CM | POA: Diagnosis not present

## 2023-07-01 DIAGNOSIS — Z780 Asymptomatic menopausal state: Secondary | ICD-10-CM | POA: Diagnosis not present

## 2023-07-01 DIAGNOSIS — R7303 Prediabetes: Secondary | ICD-10-CM | POA: Diagnosis not present

## 2023-07-28 DIAGNOSIS — H401121 Primary open-angle glaucoma, left eye, mild stage: Secondary | ICD-10-CM | POA: Diagnosis not present

## 2023-07-28 DIAGNOSIS — H2513 Age-related nuclear cataract, bilateral: Secondary | ICD-10-CM | POA: Diagnosis not present

## 2023-07-28 DIAGNOSIS — H04123 Dry eye syndrome of bilateral lacrimal glands: Secondary | ICD-10-CM | POA: Diagnosis not present

## 2023-07-28 DIAGNOSIS — H52223 Regular astigmatism, bilateral: Secondary | ICD-10-CM | POA: Diagnosis not present

## 2023-07-28 DIAGNOSIS — H401111 Primary open-angle glaucoma, right eye, mild stage: Secondary | ICD-10-CM | POA: Diagnosis not present

## 2023-07-28 DIAGNOSIS — H5203 Hypermetropia, bilateral: Secondary | ICD-10-CM | POA: Diagnosis not present

## 2023-09-26 ENCOUNTER — Other Ambulatory Visit (HOSPITAL_COMMUNITY): Payer: Self-pay

## 2023-09-26 MED ORDER — INFLUENZA VAC A&B SURF ANT ADJ 0.5 ML IM SUSY
0.5000 mL | PREFILLED_SYRINGE | Freq: Once | INTRAMUSCULAR | 0 refills | Status: AC
  Filled 2023-09-26: qty 0.5, 1d supply, fill #0

## 2024-05-24 DIAGNOSIS — R030 Elevated blood-pressure reading, without diagnosis of hypertension: Secondary | ICD-10-CM | POA: Diagnosis not present

## 2024-07-13 ENCOUNTER — Other Ambulatory Visit: Payer: Self-pay | Admitting: Family Medicine

## 2024-07-13 DIAGNOSIS — Z1231 Encounter for screening mammogram for malignant neoplasm of breast: Secondary | ICD-10-CM

## 2024-07-13 DIAGNOSIS — Z136 Encounter for screening for cardiovascular disorders: Secondary | ICD-10-CM | POA: Diagnosis not present

## 2024-07-13 DIAGNOSIS — Z0000 Encounter for general adult medical examination without abnormal findings: Secondary | ICD-10-CM | POA: Diagnosis not present

## 2024-07-13 DIAGNOSIS — Z780 Asymptomatic menopausal state: Secondary | ICD-10-CM | POA: Diagnosis not present

## 2024-07-13 DIAGNOSIS — Z1331 Encounter for screening for depression: Secondary | ICD-10-CM | POA: Diagnosis not present

## 2024-07-13 DIAGNOSIS — R7303 Prediabetes: Secondary | ICD-10-CM | POA: Diagnosis not present

## 2024-07-30 ENCOUNTER — Encounter
